# Patient Record
Sex: Male | Born: 1960 | Race: White | Hispanic: No | Smoking: Never smoker
Health system: Southern US, Community
[De-identification: ages and names within clinical notes are randomized; demographics above are authoritative.]

## PROBLEM LIST (undated history)

## (undated) DIAGNOSIS — K219 Gastro-esophageal reflux disease without esophagitis: Secondary | ICD-10-CM

## (undated) DIAGNOSIS — N200 Calculus of kidney: Secondary | ICD-10-CM

## (undated) HISTORY — DX: Calculus of kidney: N20.0

---

## 2021-07-03 ENCOUNTER — Ambulatory Visit: Payer: BC Managed Care – PPO

## 2021-07-03 ENCOUNTER — Ambulatory Visit (INDEPENDENT_AMBULATORY_CARE_PROVIDER_SITE_OTHER): Payer: BC Managed Care – PPO

## 2021-07-03 ENCOUNTER — Ambulatory Visit
Admission: EM | Admit: 2021-07-03 | Discharge: 2021-07-03 | Disposition: A | Discharge status: Home / Self Care | Payer: BC Managed Care – PPO

## 2021-07-03 DIAGNOSIS — M79672 Pain in left foot: Secondary | ICD-10-CM

## 2021-07-03 DIAGNOSIS — W133XXA Fall through floor, initial encounter: Secondary | ICD-10-CM

## 2021-07-03 DIAGNOSIS — S93402A Sprain of unspecified ligament of left ankle, initial encounter: Secondary | ICD-10-CM | POA: Diagnosis not present

## 2021-07-03 DIAGNOSIS — M25572 Pain in left ankle and joints of left foot: Secondary | ICD-10-CM | POA: Diagnosis not present

## 2021-07-03 HISTORY — DX: Gastro-esophageal reflux disease without esophagitis: K21.9

## 2021-07-03 MED ORDER — IBUPROFEN 600 MG PO TABS: 600.0000 mg | ORAL_TABLET | Freq: Four times a day (QID) | ORAL | 0 refills | Status: DC | PRN

## 2021-07-03 NOTE — ED Provider Notes (Signed)
[facility]    CSN: [id number] Arrival date & time: 07/03/21  1012      History   Chief Complaint Chief Complaint  Patient presents with   Foot Pain    Left    HPI Darren Choi is a 61 y.o. male.   HPI  61 year old male here for evaluation of orthopedic complaints.  Patient ports that he fell through the floor of the deck that he had built yesterday when the floor just gave out and states that most of his body weight was on his left leg, foot, and ankle.  He is complaining of pain in his heel mostly had not his foot.  He is also having pain in his ankle on the outside.  There is swelling and bruising to the left lower extremity.  He states it hurts to walk on but he is able to bear weight.  He denies any numbness or tingling in his toes.  Past Medical History:  Diagnosis Date   GERD (gastroesophageal reflux disease)     There are no problems to display for this patient.   History reviewed. No pertinent surgical history.     Home Medications    Prior to Admission medications   Medication Sig Start Date End Date Taking? Authorizing Provider  ibuprofen (ADVIL) 600 MG tablet Take 1 tablet (600 mg total) by mouth every 6 (six) hours as needed. 07/03/21  Yes Becky Augusta, NP  omeprazole (PRILOSEC) 20 MG capsule TAKE ONE CAPSULE BY MOUTH EVERY MORNING AND AT BEDTIME FOR GASTROESOPHAEAL REFLUX DISEASE, HIATAL HERNIA 02/17/20  Yes [provider]    Family History No family history on file.  Social History Social History   Tobacco Use   Smoking status: Never   Smokeless tobacco: Never  Vaping Use   Vaping Use: Never used  Substance Use Topics   Alcohol use: Yes    Comment: Occ.   Drug use: Not Currently     Allergies   Amoxicillin   Review of Systems Review of Systems  Musculoskeletal:  Positive for arthralgias and joint swelling. Negative for gait problem.  Skin:  Positive for color change. Negative for wound.  Neurological:   Negative for weakness and numbness.  Hematological: Negative.   Psychiatric/Behavioral: Negative.      Physical Exam Triage Vital Signs ED Triage Vitals  Enc Vitals Group     BP 07/03/21 1034 114/82     Pulse Rate 07/03/21 1034 77     Resp 07/03/21 1034 18     Temp 07/03/21 1034 98.4 F (36.9 C)     Temp Source 07/03/21 1034 Oral     SpO2 07/03/21 1034 99 %     Weight 07/03/21 1031 170 lb (77.1 kg)     Height 07/03/21 1031 5\' 9"  (1.753 m)     Head Circumference --      Peak Flow --      Pain Score 07/03/21 1027 9     Pain Loc --      Pain Edu? --      Excl. in GC? --    No data found.  Updated Vital Signs BP 114/82 (BP Location: Left Arm)   Pulse 77   Temp 98.4 F (36.9 C) (Oral)   Resp 18   Ht 5\' 9"  (1.753 m)   Wt 170 lb (77.1 kg)   SpO2 99%   BMI 25.10 kg/m   Visual Acuity Right Eye Distance:   Left Eye Distance:   Bilateral  Distance:    Right Eye Near:   Left Eye Near:    Bilateral Near:     Physical Exam Vitals and nursing note reviewed.  Constitutional:      Appearance: Normal appearance.  Musculoskeletal:        General: Swelling, tenderness and signs of injury present. No deformity. Normal range of motion.  Skin:    General: Skin is warm and dry.     Capillary Refill: Capillary refill takes less than 2 seconds.     Findings: Bruising present.  Neurological:     General: No focal deficit present.     Mental Status: He is alert and oriented to person, place, and time.  Psychiatric:        Mood and Affect: Mood normal.        Behavior: Behavior normal.        Thought Content: Thought content normal.        Judgment: Judgment normal.     UC Treatments / Results  Labs (all labs ordered are listed, but only abnormal results are displayed) Labs Reviewed - No data to display  EKG   Radiology DG Ankle Complete Left  Result Date: 07/03/2021 CLINICAL DATA:  Larey Seat through floor.  Left ankle pain and swelling. EXAM: LEFT ANKLE COMPLETE - 3+  VIEW COMPARISON:  None Available. FINDINGS: There is no evidence of fracture, dislocation, or joint effusion. There is no evidence of arthropathy or other focal bone abnormality. Lateral soft tissue swelling noted, but no radiopaque foreign body identified. IMPRESSION: Lateral soft tissue swelling. No evidence of fracture or dislocation. Electronically Signed   By: Danae Orleans M.D.   On: 07/03/2021 11:04   DG Foot Complete Left  Result Date: 07/03/2021 CLINICAL DATA:  Trauma, fall, pain EXAM: LEFT FOOT - COMPLETE 3+ VIEW COMPARISON:  None Available. FINDINGS: No recent fracture or dislocation is seen. 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 IMPRESSION: No recent fracture or dislocation is seen in the left foot. Electronically Signed   By: Ernie Avena M.D.   On: 07/03/2021 11:05    Procedures Procedures (including critical care time)  Medications Ordered in UC Medications - No data to display  Initial Impression / Assessment and Plan / UC Course  I have reviewed the triage vital signs and the nursing notes.  Pertinent labs & imaging results that were available during my care of the patient were reviewed by me and considered in my medical decision making (see chart for details).  Patient is a pleasant, nontoxic-appearing 61 year old male here for evaluation of left foot and ankle pain after falling through a deck floor yesterday.  He states that the deck was approximately 3 feet off the ground and most of his weight was on his left leg.  He denies any numbness or tingling in his toes and is able to bear weight but he does so with pain.  On exam patient's left foot and ankle are normal anatomical alignment.  His DP and PT pulses are 2+.  He is missing the distal aspect of his great toe and his second toe which she attributes to a lawnmower accident.  He does have full range of motion of his foot and ankle and full sensation in his toes.  There is mild ecchymosis to  the medial aspect of the ankle but there is more significant ecchymosis and swelling to the lateral aspect of the ankle.  He does have tenderness with palpation of the distal  third of the lateral malleolus.  Medial malleolus is nontender.  There is no tenderness with palpation of the arch or midfoot.  No pain with palpation of the calcaneus or Achilles tendon.  We will obtain radiograph of left foot and ankle.  Left foot x-rays independently reviewed and evaluated by me.  Impression: No evidence of fracture or dislocation.  There is soft tissue swelling present.  Radiology overread is pending. Radiology impression states there is no fracture or dislocation that is seen in the left foot.  Left ankle x-ray independently reviewed and evaluated by me.  Impression: No evidence of fracture or dislocation.  There is lateral soft tissue swelling visible on the x-ray.  Radiology overread is pending. Radiology impression states that there is no evidence of fracture, dislocation, or joint effusion.  No evidence of arthropathy or focal bone normality.  Lateral soft tissue swelling is noted.  No radiopaque foreign bodies identified.  I will discharge patient home with a diagnosis of left ankle contusion/sprain.  I will treat him as if it is a sprain with an ASO brace, elevation, rest, ice, compression, and ibuprofen.  I have advised him that he should keep his ankle elevated is much as possible to decrease swelling and aid in healing.  He works for [employer] [place] and states that he has to walk a lot for his job.  I have given him a work note.   Final Clinical Impressions(s) / UC Diagnoses   Final diagnoses:  Sprain of left ankle, unspecified ligament, initial encounter  Foot pain, left     Discharge Instructions      Keep your ankle elevated is much as possible to help decrease swelling and aid in healing.  Apply moist heat to your ankle for 20 minutes at a time to help improve blood flow which will  bring fresh oxygen and nutrients to the ligaments and help facilitate the removal of metabolic byproducts from inflammation.  Take ibuprofen, 600 mg every 6 hours with food to help with inflammation and pain.  Wear the ASO ankle brace when up and moving.  You may take it off at nighttime, when bathing, and when not walking on her ankle.  Follow the rehabilitation exercises given your discharge instructions.  Wait to start the phase 1 exercises until 48 hours after your injury to give time for the inflammation to go down.  Progress to phase 2 after you can complete phase 1 with out any significant pain.      ED Prescriptions     Medication Sig Dispense Auth. Provider   ibuprofen (ADVIL) 600 MG tablet Take 1 tablet (600 mg total) by mouth every 6 (six) hours as needed. 30 tablet Becky Augustayan, Daisee Centner, NP      PDMP not reviewed this encounter.   Becky Augustayan, Verl Kitson, NP 07/03/21 1214

## 2021-07-03 NOTE — ED Triage Notes (Signed)
Patient is here for "Left foot pain". Fell through a floor that I built. "Larey Seat completely through floor with body weight on left foot, ankle, leg".  Seems to be centralized in heel, foot. No head injury. No lacerations. Abrasion on lower right leg. DOI: [id number]. Time: "around lunch time".

## 2021-07-03 NOTE — Discharge Instructions (Signed)
Keep your ankle elevated is much as possible to help decrease swelling and aid in healing.  Apply moist heat to your ankle for 20 minutes at a time to help improve blood flow which will bring fresh oxygen and nutrients to the ligaments and help facilitate the removal of metabolic byproducts from inflammation.  Take ibuprofen, 600 mg every 6 hours with food to help with inflammation and pain.  Wear the ASO ankle brace when up and moving.  You may take it off at nighttime, when bathing, and when not walking on her ankle.  Follow the rehabilitation exercises given your discharge instructions.  Wait to start the phase 1 exercises until 48 hours after your injury to give time for the inflammation to go down.  Progress to phase 2 after you can complete phase 1 with out any significant pain.

## 2022-02-08 ENCOUNTER — Emergency Department: Payer: BC Managed Care – PPO

## 2022-02-08 ENCOUNTER — Emergency Department
Admission: EM | Admit: 2022-02-08 | Discharge: 2022-02-09 | Discharge status: Against Medical Advice | Payer: BC Managed Care – PPO | Attending: Emergency Medicine | Admitting: Emergency Medicine

## 2022-02-08 ENCOUNTER — Encounter: Payer: Self-pay | Admitting: Emergency Medicine

## 2022-02-08 DIAGNOSIS — R519 Headache, unspecified: Secondary | ICD-10-CM | POA: Insufficient documentation

## 2022-02-08 DIAGNOSIS — R42 Dizziness and giddiness: Secondary | ICD-10-CM | POA: Diagnosis not present

## 2022-02-08 DIAGNOSIS — Z5321 Procedure and treatment not carried out due to patient leaving prior to being seen by health care provider: Secondary | ICD-10-CM | POA: Insufficient documentation

## 2022-02-08 LAB — BASIC METABOLIC PANEL
Anion gap: 8 (ref 5–15)
BUN: 21 mg/dL — ABNORMAL HIGH (ref 6–20)
CO2: 22 mmol/L (ref 22–32)
Calcium: 9 mg/dL (ref 8.9–10.3)
Chloride: 110 mmol/L (ref 98–111)
Creatinine, Ser: 1.09 mg/dL (ref 0.61–1.24)
GFR, Estimated: >60 mL/min (ref >60)
Glucose, Bld: 99 mg/dL (ref 70–99)
Potassium: 4.2 mmol/L (ref 3.5–5.1)
Sodium: 140 mmol/L (ref 135–145)

## 2022-02-08 LAB — CBC
HCT: 40.6 % (ref 39.0–52.0)
Hemoglobin: 13.5 g/dL (ref 13.0–17.0)
MCH: 29.9 pg (ref 26.0–34.0)
MCHC: 33.3 g/dL (ref 30.0–36.0)
MCV: 89.8 fL (ref 80.0–100.0)
Platelets: 240 10*3/uL (ref 150–400)
RBC: 4.52 MIL/uL (ref 4.22–5.81)
RDW: 12.2 % (ref 11.5–15.5)
WBC: 6.4 10*3/uL (ref 4.0–10.5)
nRBC: 0 % (ref 0.0–0.2)

## 2022-02-08 LAB — TROPONIN I (HIGH SENSITIVITY): Troponin I (High Sensitivity): 2 ng/L (ref <18)

## 2022-02-08 NOTE — ED Triage Notes (Signed)
Pt presents via POV with complaints of "sharp pain" in the back of his head with associated dizziness that started tonight. He notes the pain started while he was at work- caused him to feel light headed and dizzy but no LOC or falls. Didn't hit his head, not hx of same. Denies CP or SOB.

## 2022-02-09 LAB — TROPONIN I (HIGH SENSITIVITY): Troponin I (High Sensitivity): 2 ng/L (ref <18)

## 2022-06-21 ENCOUNTER — Other Ambulatory Visit: Payer: Self-pay

## 2022-06-21 ENCOUNTER — Ambulatory Visit (INDEPENDENT_AMBULATORY_CARE_PROVIDER_SITE_OTHER): Payer: BC Managed Care – PPO

## 2022-06-21 ENCOUNTER — Ambulatory Visit
Admission: EM | Admit: 2022-06-21 | Discharge: 2022-06-21 | Disposition: A | Discharge status: Home / Self Care | Payer: BC Managed Care – PPO | Attending: Family Medicine | Admitting: Family Medicine

## 2022-06-21 DIAGNOSIS — J209 Acute bronchitis, unspecified: Secondary | ICD-10-CM | POA: Diagnosis not present

## 2022-06-21 DIAGNOSIS — Z720 Tobacco use: Secondary | ICD-10-CM

## 2022-06-21 MED ORDER — ALBUTEROL SULFATE HFA 108 (90 BASE) MCG/ACT IN AERS: 2.0000 | INHALATION_SPRAY | RESPIRATORY_TRACT | 0 refills | Status: DC | PRN

## 2022-06-21 MED ORDER — AZITHROMYCIN 250 MG PO TABS: ORAL_TABLET | ORAL | 0 refills | Status: DC

## 2022-06-21 MED ORDER — PREDNISONE 20 MG PO TABS: 40.0000 mg | ORAL_TABLET | Freq: Every day | ORAL | 0 refills | Status: AC

## 2022-06-21 NOTE — Discharge Instructions (Addendum)
Your chest x-ray showed evidence of bronchitis.  Stop by the pharmacy to pick up your steroids, antibiotics and albuterol inhaler and use as prescribed.  Go to the emergency department if your shortness of breath worsens or is accompanied by chest pain, back pain, sweating or vomiting.

## 2022-06-21 NOTE — ED Provider Notes (Signed)
[facility]    CSN: [id number] Arrival date & time: 06/21/22  7829      History   Chief Complaint Chief Complaint  Patient presents with   Cough   Headache   Fatigue    HPI Darren Choi is a 62 y.o. male.   HPI   Darren Choi presents for cough, headache and fatigue for the past 2 days. Denies fever, nasal congestion, rhinorrhea, vomiting, diarrhea, chest pain and rash.  He has been cutting dust and grass which he his allergic too. He is a former smoker who quit about 10 years ago. She smoked about a pack a day.      Past Medical History:  Diagnosis Date   GERD (gastroesophageal reflux disease)     There are no problems to display for this patient.   History reviewed. No pertinent surgical history.     Home Medications    Prior to Admission medications   Medication Sig Start Date End Date Taking? Authorizing Provider  albuterol (VENTOLIN HFA) 108 (90 Base) MCG/ACT inhaler Inhale 2 puffs into the lungs every 4 (four) hours as needed. 06/21/22  Yes Tyla Burgner, DO  azithromycin (ZITHROMAX Z-PAK) 250 MG tablet Take 2 tablets on day 1 then 1 tablet daily 06/21/22  Yes Jadan Rouillard, DO  omeprazole (PRILOSEC) 20 MG capsule TAKE ONE CAPSULE BY MOUTH EVERY MORNING AND AT BEDTIME FOR GASTROESOPHAEAL REFLUX DISEASE, HIATAL HERNIA 02/17/20  Yes [provider]  predniSONE (DELTASONE) 20 MG tablet Take 2 tablets (40 mg total) by mouth daily for 5 days. 06/21/22 06/26/22 Yes Demorris Choyce, DO  ibuprofen (ADVIL) 600 MG tablet Take 1 tablet (600 mg total) by mouth every 6 (six) hours as needed. 07/03/21   Becky Augusta, NP    Family History History reviewed. No pertinent family history.  Social History Social History   Tobacco Use   Smoking status: Never   Smokeless tobacco: Never  Vaping Use   Vaping Use: Never used  Substance Use Topics   Alcohol use: Yes    Comment: Occ.   Drug use: Not Currently     Allergies   Amoxicillin   Review of  Systems Review of Systems: negative unless otherwise stated in HPI.      Physical Exam Triage Vital Signs ED Triage Vitals [06/21/22 0932]  Enc Vitals Group     BP      Pulse      Resp 16     Temp      Temp Source Oral     SpO2      Weight      Height      Head Circumference      Peak Flow      Pain Score      Pain Loc      Pain Edu?      Excl. in GC?    No data found.  Updated Vital Signs BP 123/86 (BP Location: Left Arm)   Pulse (!) 110   Temp 98.8 F (37.1 C) (Oral)   Resp 16   Ht 5\' 9"  (1.753 m)   Wt 80.7 kg   SpO2 94%   BMI 26.29 kg/m   Visual Acuity Right Eye Distance:   Left Eye Distance:   Bilateral Distance:    Right Eye Near:   Left Eye Near:    Bilateral Near:     Physical Exam GEN:     alert, non-toxic appearing male in no distress    HENT:  mucus membranes moist, no nasal discharge EYES:   pupils equal and reactive, no scleral injection or discharge NECK:  normal ROM, no meningismus   RESP:  no increased work of breathing, coarse breathe sounds in bilateral lung bases  CVS:   regular  rhythm, tachycardic Skin:   warm and dry, no rash on visible skin    UC Treatments / Results  Labs (all labs ordered are listed, but only abnormal results are displayed) Labs Reviewed - No data to display  EKG   Radiology DG Chest 2 View  Result Date: 06/21/2022 CLINICAL DATA:  Shortness of breath, cough, headache, and fatigue for 2 days EXAM: CHEST - 2 VIEW COMPARISON:  02/08/2022 FINDINGS: Normal heart size, mediastinal contours, and pulmonary vascularity. Small hiatal hernia incidentally noted. Minimal chronic bronchitic changes. No acute infiltrate, pleural effusion, or pneumothorax. Osseous structures unremarkable. IMPRESSION: Small hiatal hernia. Bronchitic changes without infiltrate. Electronically Signed   By: Ulyses Southward M.D.   On: 06/21/2022 10:24    Procedures Procedures (including critical care time)  Medications Ordered in UC Medications  - No data to display  Initial Impression / Assessment and Plan / UC Course  I have reviewed the triage vital signs and the nursing notes.  Pertinent labs & imaging results that were available during my care of the patient were reviewed by me and considered in my medical decision making (see chart for details).       Pt is a 62 y.o. male former smoker who presents for cough with shortness of breath. Declin is afebrile here without recent antipyretics. Satting 90-94% on room air. Overall pt is non-toxic appearing, well hydrated, without respiratory distress. Pulmonary exam is remarkable for bibasilar coarse breath sounds. Chest xray personally reviewed by me without focal pneumonia, pleural effusion, cardiomegaly or pneumothorax.  Radiologist notes chronic bronchial changes.  I suspect patient may have underlying COPD.  Treat with steroids and antibiotics as below.  Albuterol inhaler for shortness of breath.  Typical duration of symptoms discussed.  ED precautions given and patient voiced understanding.  Return and ED precautions given and voiced understanding. Discussed MDM, treatment plan and plan for follow-up with patient who agrees with plan.     Final Clinical Impressions(s) / UC Diagnoses   Final diagnoses:  Acute bronchitis, unspecified organism  Tobacco use     Discharge Instructions      Your chest x-ray showed evidence of bronchitis.  Stop by the pharmacy to pick up your steroids, antibiotics and albuterol inhaler and use as prescribed.  Go to the emergency department if your shortness of breath worsens or is accompanied by chest pain, back pain, sweating or vomiting.     ED Prescriptions     Medication Sig Dispense Auth. Provider   albuterol (VENTOLIN HFA) 108 (90 Base) MCG/ACT inhaler Inhale 2 puffs into the lungs every 4 (four) hours as needed. 6.7 g Marsena Taff, DO   azithromycin (ZITHROMAX Z-PAK) 250 MG tablet Take 2 tablets on day 1 then 1 tablet daily 6  tablet Nixon Kolton, DO   predniSONE (DELTASONE) 20 MG tablet Take 2 tablets (40 mg total) by mouth daily for 5 days. 10 tablet Katha Cabal, DO      PDMP not reviewed this encounter.   Katha Cabal, DO 06/21/22 1047

## 2022-06-21 NOTE — ED Triage Notes (Addendum)
Pt c/o cough,HA & fatigue x2 days. Denies any fevers, has tried Nyquil,benadryl & sudafed w/o relief. States he also had 1 episode of sob yesterday, denies any sob currently.

## 2024-01-15 ENCOUNTER — Ambulatory Visit
Admission: EM | Admit: 2024-01-15 | Discharge: 2024-01-15 | Disposition: A | Discharge status: Home / Self Care | Attending: Emergency Medicine | Admitting: Emergency Medicine

## 2024-01-15 ENCOUNTER — Encounter: Payer: Self-pay | Admitting: Emergency Medicine

## 2024-01-15 DIAGNOSIS — J101 Influenza due to other identified influenza virus with other respiratory manifestations: Secondary | ICD-10-CM | POA: Diagnosis not present

## 2024-01-15 LAB — POCT INFLUENZA A/B
Influenza A, POC: POSITIVE — AB
Influenza B, POC: NEGATIVE

## 2024-01-15 MED ORDER — OSELTAMIVIR PHOSPHATE 75 MG PO CAPS: 75.0000 mg | ORAL_CAPSULE | Freq: Two times a day (BID) | ORAL | 0 refills | Status: DC

## 2024-01-15 MED ORDER — IBUPROFEN 600 MG PO TABS: 600.0000 mg | ORAL_TABLET | Freq: Four times a day (QID) | ORAL | 0 refills | Status: DC | PRN

## 2024-01-15 MED ORDER — ALBUTEROL SULFATE HFA 108 (90 BASE) MCG/ACT IN AERS: 1.0000 | INHALATION_SPRAY | RESPIRATORY_TRACT | 0 refills | Status: DC | PRN

## 2024-01-15 MED ORDER — AEROCHAMBER MV MISC: 1 refills | Status: DC

## 2024-01-15 MED ORDER — HYDROCOD POLI-CHLORPHE POLI ER 10-8 MG/5ML PO SUER: 5.0000 mL | Freq: Two times a day (BID) | ORAL | 0 refills | Status: DC | PRN

## 2024-01-15 NOTE — Discharge Instructions (Signed)
 Finish the Tamiflu, even if you feel better.  Mucinex, saline nasal irrigation with a Aureliano Med rinse with distilled water as often as you want, 600 mg of ibuprofen , 1000 mg of Tylenol together 3-4 times a day as needed for body aches, headaches.  2 puffs from your albuterol  inhaler using your spacer every 4-6 hours for coughing, wheezing.  Tussionex for cough.

## 2024-01-15 NOTE — ED Provider Notes (Signed)
 HPI  SUBJECTIVE:  Darren Choi is a 63 y.o. male who presents with fevers Tmax 101, body aches, headaches, nasal congestion, rhinorrhea, wheezing starting yesterday.  He was exposed to the flu over Thanksgiving from his grandkids.  No postnasal drip, sore throat, sinus pain or pressure, chest pain, shortness of breath.  Reports a dry cough that has been present for 3 weeks after having a URI.  The other symptoms have resolved, but the cough persists.  It has become slightly productive, but of clear sputum.  States that he is unable to sleep at night because of the cough.  He did not get the flu vaccine.  No antibiotics in the past 3 months.  No antipyretic in the past 6 hours.  He tried NyQuil without improvement of symptoms.  No aggravating factors.  He has a past medical history of GERD.  No history of pulm disease, smoking, chronic disease, diabetes, hypertension.  PCP: Nichole Arlyss Thresa Bernardino    Past Medical History:  Diagnosis Date   GERD (gastroesophageal reflux disease)     History reviewed. No pertinent surgical history.  History reviewed. No pertinent family history.  Social History   Tobacco Use   Smoking status: Never   Smokeless tobacco: Never  Vaping Use   Vaping status: Never Used  Substance Use Topics   Alcohol use: Yes    Comment: Occ.   Drug use: Not Currently    No current facility-administered medications for this encounter.  Current Outpatient Medications:    albuterol  (VENTOLIN  HFA) 108 (90 Base) MCG/ACT inhaler, Inhale 1-2 puffs into the lungs every 4 (four) hours as needed for wheezing or shortness of breath., Disp: 1 each, Rfl: 0   chlorpheniramine-HYDROcodone (TUSSIONEX) 10-8 MG/5ML, Take 5 mLs by mouth every 12 (twelve) hours as needed for cough., Disp: 60 mL, Rfl: 0   ibuprofen  (ADVIL ) 600 MG tablet, Take 1 tablet (600 mg total) by mouth every 6 (six) hours as needed., Disp: 30 tablet, Rfl: 0   oseltamivir (TAMIFLU) 75 MG capsule, Take 1 capsule  (75 mg total) by mouth 2 (two) times daily. X 5 days, Disp: 10 capsule, Rfl: 0   Spacer/Aero-Holding Chambers (AEROCHAMBER MV) inhaler, Use as instructed, Disp: 1 each, Rfl: 1   omeprazole (PRILOSEC) 20 MG capsule, TAKE ONE CAPSULE BY MOUTH EVERY MORNING AND AT BEDTIME FOR GASTROESOPHAEAL REFLUX DISEASE, HIATAL HERNIA, Disp: , Rfl:   Allergies  Allergen Reactions   Amoxicillin Hives     ROS  As noted in HPI.   Physical Exam  BP 104/83 (BP Location: Left Arm)   Pulse (!) 106   Temp 99.7 F (37.6 C) (Oral)   Resp 18   Wt 88.5 kg   SpO2 98%   BMI 28.80 kg/m   Constitutional: Well developed, well nourished, no acute distress.  Coughing. Eyes: PERRL, EOMI, conjunctiva normal bilaterally HENT: Normocephalic, atraumatic,mucus membranes moist positive nasal congestion.  Erythematous, swollen nasal mucosa.  No maxillary, frontal sinus tenderness.  Normal oropharynx. Neck: No cervical lymphadenopathy Respiratory: Clear to auscultation bilaterally, no rales, no wheezing, no rhonchi.  Fair air movement.  No anterior, lateral chest wall tenderness Cardiovascular: Regular tachycardia, no murmurs, no gallops, no rubs GI: nondistended skin: No rash, skin intact Musculoskeletal: no deformities Neurologic: Alert & oriented x 3, CN III-XII grossly intact, no motor deficits, sensation grossly intact Psychiatric: Speech and behavior appropriate   ED Course   Medications - No data to display  Orders Placed This Encounter  Procedures   POC  Influenza A/B    Standing Status:   Standing    Number of Occurrences:   1   Results for orders placed or performed during the hospital encounter of 01/15/24 (from the past 24 hours)  POC Influenza A/B     Status: Abnormal   Collection Time: 01/15/24 12:39 PM  Result Value Ref Range   Influenza A, POC Positive (A) Negative   Influenza B, POC Negative Negative   No results found.  ED Clinical Impression  1. Influenza A      ED  Assessment/Plan     Patient presents with an acute illness with systemic symptoms of tachycardia.  Influenza A positive.  Discussed all results with patient and family member while in department.  [place]  narcotic database reviewed.  Feel it is appropriate to proceed with a prescription for controlled substance.  No opiate prescriptions in the past 2 years  Patient reports a cough for 3 weeks, but states that it has not significantly changed.  He does have a history of GERD.  I considered getting a chest x-ray today, but given that he has new symptoms and he is flu A positive, I suspect that if he has a pneumonia, it could be influenza.  Advised him to follow-up with his PCP or return here if the cough persist or get worse and we can do a chest x-ray at that time.  He and his family member are amenable to this plan.  Last metabolic panel in December 23.  Calculated creatinine clearance from that is 89 mL/min.  He denies history of chronic kidney disease.  Sending home with Tamiflu , Tussionex, Mucinex, Flonase, Tylenol combined with ibuprofen  3-4 times a day, and albuterol  inhaler with spacer 2 puffs every 4-6 hours.  Work note.  Discussed labs,  MDM, treatment plan, and plan for follow-up with patient and family member.  They agree  with plan.   Meds ordered this encounter  Medications   oseltamivir  (TAMIFLU ) 75 MG capsule    Sig: Take 1 capsule (75 mg total) by mouth 2 (two) times daily. X 5 days    Dispense:  10 capsule    Refill:  0   albuterol  (VENTOLIN  HFA) 108 (90 Base) MCG/ACT inhaler    Sig: Inhale 1-2 puffs into the lungs every 4 (four) hours as needed for wheezing or shortness of breath.    Dispense:  1 each    Refill:  0   chlorpheniramine-HYDROcodone (TUSSIONEX) 10-8 MG/5ML    Sig: Take 5 mLs by mouth every 12 (twelve) hours as needed for cough.    Dispense:  60 mL    Refill:  0   Spacer/Aero-Holding Chambers (AEROCHAMBER MV) inhaler    Sig: Use as instructed     Dispense:  1 each    Refill:  1   ibuprofen  (ADVIL ) 600 MG tablet    Sig: Take 1 tablet (600 mg total) by mouth every 6 (six) hours as needed.    Dispense:  30 tablet    Refill:  0      *This clinic note was created using Scientist, clinical (histocompatibility and immunogenetics). Therefore, there may be occasional mistakes despite careful proofreading. ?    Van Knee, MD 01/15/24 [id number]

## 2024-01-15 NOTE — ED Triage Notes (Signed)
 Pt presents with cough, fever and bodyaches since yesterday. Pt has taken NyQuil for his symptoms.

## 2024-02-20 ENCOUNTER — Other Ambulatory Visit: Payer: Self-pay | Admitting: Internal Medicine

## 2024-02-20 NOTE — Telephone Encounter (Signed)
 Copied from CRM #[id number]. Topic: Clinical - Medication Refill >> Feb 20, 2024 10:17 AM Hadassah PARAS wrote: Medication: omeprazole (PRILOSEC) 40 MG capsule   Has the patient contacted their pharmacy? No (Agent: If no, request that the patient contact the pharmacy for the refill. If patient does not wish to contact the pharmacy document the reason why and proceed with request.) (Agent: If yes, when and what did the pharmacy advise?)  This is the patient's preferred pharmacy:  [facility] Address: [address] [place], [place], [place] [address] Phone: [phone]   Is this the correct pharmacy for this prescription? Yes If no, delete pharmacy and type the correct one.   Has the prescription been filled recently? No  Is the patient out of the medication? Yes  Has the patient been seen for an appointment in the last year OR does the patient have an upcoming appointment? Yes  Can we respond through MyChart? Yes  Agent: Please be advised that Rx refills may take up to 3 business days. We ask that you follow-up with your pharmacy.

## 2024-02-21 NOTE — Telephone Encounter (Signed)
 Requested Prescriptions  Refused Prescriptions Disp Refills   omeprazole  (PRILOSEC) 20 MG capsule       Gastroenterology: Proton Pump Inhibitors Failed - 02/21/2024  1:01 PM      Failed - Valid encounter within last 12 months    Recent Outpatient Visits   None

## 2024-02-28 ENCOUNTER — Encounter: Payer: Self-pay | Admitting: Internal Medicine

## 2024-02-28 ENCOUNTER — Other Ambulatory Visit: Payer: Self-pay

## 2024-02-28 ENCOUNTER — Telehealth: Payer: Self-pay

## 2024-02-28 ENCOUNTER — Ambulatory Visit: Admitting: Internal Medicine

## 2024-02-28 VITALS — BP 112/82 | Ht 69.0 in | Wt 193.0 lb

## 2024-02-28 DIAGNOSIS — L308 Other specified dermatitis: Secondary | ICD-10-CM

## 2024-02-28 DIAGNOSIS — Z136 Encounter for screening for cardiovascular disorders: Secondary | ICD-10-CM

## 2024-02-28 DIAGNOSIS — Z87442 Personal history of urinary calculi: Secondary | ICD-10-CM | POA: Insufficient documentation

## 2024-02-28 DIAGNOSIS — L309 Dermatitis, unspecified: Secondary | ICD-10-CM | POA: Insufficient documentation

## 2024-02-28 DIAGNOSIS — K219 Gastro-esophageal reflux disease without esophagitis: Secondary | ICD-10-CM | POA: Diagnosis not present

## 2024-02-28 DIAGNOSIS — R739 Hyperglycemia, unspecified: Secondary | ICD-10-CM | POA: Diagnosis not present

## 2024-02-28 DIAGNOSIS — R252 Cramp and spasm: Secondary | ICD-10-CM

## 2024-02-28 DIAGNOSIS — Z114 Encounter for screening for human immunodeficiency virus [HIV]: Secondary | ICD-10-CM | POA: Diagnosis not present

## 2024-02-28 DIAGNOSIS — Z1159 Encounter for screening for other viral diseases: Secondary | ICD-10-CM | POA: Diagnosis not present

## 2024-02-28 MED ORDER — OMEPRAZOLE 40 MG PO CPDR: 40.0000 mg | DELAYED_RELEASE_CAPSULE | Freq: Two times a day (BID) | ORAL | 1 refills | Status: DC

## 2024-02-28 MED ORDER — OMEPRAZOLE 40 MG PO CPDR: 40.0000 mg | DELAYED_RELEASE_CAPSULE | Freq: Two times a day (BID) | ORAL | 1 refills | Status: AC

## 2024-02-28 NOTE — Assessment & Plan Note (Signed)
 Continue moisturizing lotion 2 times daily Avoid excessively hot water as this can dry the skin out further Okay to take hydrocortisone 1% cream twice daily as needed as needed for itching

## 2024-02-28 NOTE — Telephone Encounter (Signed)
 Prescription resent to [facility].

## 2024-02-28 NOTE — Assessment & Plan Note (Signed)
 Avoid foods that trigger reflux Encouraged weight loss as this can help reduce reflux symptoms Will increase omeprazole  to 40 mg twice daily Consider referral to GI for upper endoscopy if symptoms persist or worsen.

## 2024-02-28 NOTE — Patient Instructions (Signed)
 GERD in Adults: Diet Changes When you have gastroesophageal reflux disease (GERD), you may need to make changes to your diet. Choosing the right foods can help with your symptoms. Think about working with an expert in healthy eating called a dietitian. They can help you make healthy food choices. What are tips for following this plan? Reading food labels Look for foods that are low in saturated fat. Foods that may help with your symptoms include: Foods with less than 5% of daily value (DV) of fat. Foods with 0 grams of trans fat. Cooking [employer] in ways that don't use a lot of fat. These ways include: Baking. Steaming. Grilling. Broiling. To add flavor, try to use herbs that are low in spice and acidity. Avoid frying your food. Meal planning  Eat small meals often rather than eating 3 large meals each day. Eat your meals slowly in a place where you feel relaxed. If told by your health care provider, avoid: Foods that cause symptoms. Keep a food diary to keep track of foods that cause symptoms. Alcohol. Drinking a lot of liquid with meals. General instructions For 2-3 hours after you eat, avoid: Bending over. Exercise. Lying down. Chew sugar-free gum after meals. What foods should I eat? Eat a healthy diet. Try to include: Foods with high amounts of fiber. These include: Fruits and vegetables. Whole grains and beans. Low-fat dairy products. Lean meats, fish, and poultry. Egg whites. Foods that cause symptoms in someone else may not cause symptoms for you. Work with your provider to find foods that are safe for you. The items listed above may not be all the foods and drinks you can have. Talk with a dietitian to learn more. The items listed above may not be a complete list of foods and beverages you can eat and drink. Contact a dietitian for more information. What foods should I avoid? Limiting some of these foods may help with your symptoms. Each person is different.  Talk with a dietitian or your provider to help you find the exact foods to avoid. Some of the foods to avoid may include: Fruits Fruits with a lot of acid in them. These may include citrus fruits, such as oranges, grapefruit, pineapple, and lemons. Vegetables Deep-fried vegetables, such as [place] fries. Vegetables, sauces, or toppings made with added fat and vegetables with acid in them. These may include tomatoes and tomato products, chili peppers, onions, garlic, and horseradish. Grains Pastries or quick breads with added fat. Meats and other proteins High-fat meats, such as fatty beef or pork, hot dogs, ribs, ham, sausage, salami, and bacon. Fried meat or protein, such as fried fish and fried chicken. Egg yolks. Fats and oils Butter. Margarine. Shortening. Ghee. Drinks Coffee and other drinks with caffeine in them. Fizzy and sugary drinks, such as soda and energy drinks. Fruit juice made with acidic fruits, such as orange or grapefruit. Tomato juice. Sweets and desserts Chocolate and cocoa. Donuts. Seasonings and condiments Mint, such as peppermint and spearmint. Condiments, herbs, or seasonings that cause symptoms. These may include curry, hot sauce, or vinegar-based salad dressings. The items listed above may not be all the foods and drinks you should avoid. Talk with a dietitian to learn more. Questions to ask your health care provider Changes to your diet and everyday life are often the first steps taken to manage symptoms of GERD. If these changes don't help, talk with your provider about taking medicines. Where to find more information [employer] for Gastrointestinal [employer]:  aboutgerd.org This information is not intended to replace advice given to you by your health care provider. Make sure you discuss any questions you have with your health care provider. Document Revised: 12/12/2022 Document Reviewed: 06/28/2022 Elsevier Patient Education  2024 [employer].

## 2024-02-28 NOTE — Assessment & Plan Note (Signed)
 Has not been an issue lately Will monitor

## 2024-02-28 NOTE — Telephone Encounter (Signed)
 Copied from CRM #[id number]. Topic: Clinical - Prescription Issue >> Feb 28, 2024  9:55 AM Zebedee SAUNDERS wrote: Reason for CRM: Pt needs omeprazole  (PRILOSEC) 40 MG capsule sent to [id number]  [address] [place] [address] Phone: [phone] Fax: [phone], it was sent to [facility]. Please send medication to [id number].

## 2024-02-28 NOTE — Progress Notes (Signed)
 "  Subjective:    Patient ID: Darren Choi, male    DOB: Mar 07, 1960, 64 y.o.   MRN: [id number]  HPI  Patient presents the clinic today to establish care and for management of the conditions listed below.  GERD: Triggered by tomato based foods, eating too late. He does have breakthrough on omeprazole . He takes tums 2-3 times per day in addition to this. There is no upper GI on file.    History of kidney stones: Last occurrence 4-5 years ago. He follows with urology.  Eczema: He does feel like this is worse in his armpits secondary to the deodorant that he uses.  He uses lotion OTC with some relief of symptoms. He is not using any cortisone cream OTC.  He also reports muscle cramping.  This occurs mostly in his legs when he is working outside.  He does feel like he consumes adequate amounts of water as well as Gatorade.  He also takes potassium and magnesium supplements OTC.  Past Medical History:  Diagnosis Date   GERD (gastroesophageal reflux disease)     Current Outpatient Medications  Medication Sig Dispense Refill   albuterol  (VENTOLIN  HFA) 108 (90 Base) MCG/ACT inhaler Inhale 1-2 puffs into the lungs every 4 (four) hours as needed for wheezing or shortness of breath. 1 each 0   chlorpheniramine-HYDROcodone (TUSSIONEX) 10-8 MG/5ML Take 5 mLs by mouth every 12 (twelve) hours as needed for cough. 60 mL 0   ibuprofen  (ADVIL ) 600 MG tablet Take 1 tablet (600 mg total) by mouth every 6 (six) hours as needed. 30 tablet 0   omeprazole  (PRILOSEC) 20 MG capsule TAKE ONE CAPSULE BY MOUTH EVERY MORNING AND AT BEDTIME FOR GASTROESOPHAEAL REFLUX DISEASE, HIATAL HERNIA     oseltamivir  (TAMIFLU ) 75 MG capsule Take 1 capsule (75 mg total) by mouth 2 (two) times daily. X 5 days 10 capsule 0   Spacer/Aero-Holding Chambers (AEROCHAMBER MV) inhaler Use as instructed 1 each 1   No current facility-administered medications for this visit.    Allergies[1]  No family history on file.  Social History    Socioeconomic History   Marital status: Divorced    Spouse name: Not on file   Number of children: Not on file   Years of education: Not on file   Highest education level: Not on file  Occupational History   Not on file  Tobacco Use   Smoking status: Never   Smokeless tobacco: Never  Vaping Use   Vaping status: Never Used  Substance and Sexual Activity   Alcohol use: Yes    Comment: Occ.   Drug use: Not Currently   Sexual activity: Not Currently  Other Topics Concern   Not on file  Social History Narrative   Not on file   Social Drivers of Health   Tobacco Use: Low Risk (01/15/2024)   Patient History    Smoking Tobacco Use: Never    Smokeless Tobacco Use: Never    Passive Exposure: Not on file  Financial Resource Strain: Not on file  Food Insecurity: Not on file  Transportation Needs: Not on file  Physical Activity: Not on file  Stress: Not on file  Social Connections: Not on file  Intimate Partner Violence: Not on file  Depression (EYV7-0): Not on file  Alcohol Screen: Not on file  Housing: Not on file  Utilities: Not on file  Health Literacy: Not on file     Constitutional: Denies fever, malaise, fatigue, headache or abrupt weight changes.  HEENT:  Denies eye pain, eye redness, ear pain, ringing in the ears, wax buildup, runny nose, nasal congestion, bloody nose, or sore throat. Respiratory: Denies difficulty breathing, shortness of breath, cough or sputum production.   Cardiovascular: Denies chest pain, chest tightness, palpitations or swelling in the hands or feet.  Gastrointestinal: Pt reports reflux. Denies abdominal pain, bloating, constipation, diarrhea or blood in the stool.  GU: Denies urgency, frequency, pain with urination, burning sensation, blood in urine, odor or discharge. Musculoskeletal: Pt reports muscle cramps in legs .Denies decrease in range of motion, difficulty with gait, or joint pain and swelling.  Skin: Patient reports dry skin.  Denies  redness, rashes, lesions or ulcercations.  Neurological: Denies dizziness, difficulty with memory, difficulty with speech or problems with balance and coordination.  Psych: Denies anxiety, depression, SI/HI.  No other specific complaints in a complete review of systems (except as listed in HPI above).  Objective    BP 112/82 (BP Location: Left Arm, Patient Position: Sitting, Cuff Size: Normal)   Ht 5' 9 (1.753 m)   Wt 193 lb (87.5 kg)   BMI 28.50 kg/m   Wt Readings from Last 3 Encounters:  01/15/24 195 lb (88.5 kg)  06/21/22 178 lb (80.7 kg)  07/03/21 170 lb (77.1 kg)    General: Appears his stated age, overweight, in NAD. Skin: Warm, dry and intact.  HEENT: Head: normal shape and size; Eyes: sclera white, no icterus, conjunctiva pink, PERRLA and EOMs intact; Cardiovascular: Normal rate and rhythm. S1,S2 noted.  No murmur, rubs or gallops noted. No JVD or BLE edema.  Pulmonary/Chest: Normal effort and positive vesicular breath sounds. No respiratory distress. No wheezes, rales or ronchi noted.  Abdomen: Soft and nontender. Normal bowel sounds.  Musculoskeletal: No difficulty with gait.  Neurological: Alert and oriented.  Coordination normal.  Psychiatric: Mood and affect normal. Behavior is normal. Judgment and thought content normal.    BMET    Component Value Date/Time   NA 140 02/08/2022 2138   K 4.2 02/08/2022 2138   CL 110 02/08/2022 2138   CO2 22 02/08/2022 2138   GLUCOSE 99 02/08/2022 2138   BUN 21 (H) 02/08/2022 2138   CREATININE 1.09 02/08/2022 2138   CALCIUM 9.0 02/08/2022 2138   GFRNONAA >60 02/08/2022 2138    Lipid Panel  No results found for: CHOL, TRIG, HDL, CHOLHDL, VLDL, LDLCALC  CBC    Component Value Date/Time   WBC 6.4 02/08/2022 2138   RBC 4.52 02/08/2022 2138   HGB 13.5 02/08/2022 2138   HCT 40.6 02/08/2022 2138   PLT 240 02/08/2022 2138   MCV 89.8 02/08/2022 2138   MCH 29.9 02/08/2022 2138   MCHC 33.3 02/08/2022 2138   RDW  12.2 02/08/2022 2138    Hgb A1C No results found for: HGBA1C     Assessment and Plan   Muscle cramps:  Encourage adequate hydration especially when working outside We will check c-Met and magnesium levels today    RTC in 6 months for your annual exam Angeline Laura, NP     [1]  Allergies Allergen Reactions   Amoxicillin Hives   "

## 2024-02-29 ENCOUNTER — Ambulatory Visit: Payer: Self-pay | Admitting: Internal Medicine

## 2024-02-29 LAB — COMPREHENSIVE METABOLIC PANEL WITH GFR
AG Ratio: 1.3 (calc) (ref 1.0–2.5)
ALT: 26 U/L (ref 9–46)
AST: 32 U/L (ref 10–35)
Albumin: 4.5 g/dL (ref 3.6–5.1)
Alkaline phosphatase (APISO): 85 U/L (ref 35–144)
BUN: 13 mg/dL (ref 7–25)
CO2: 28 mmol/L (ref 20–32)
Calcium: 9.5 mg/dL (ref 8.6–10.3)
Chloride: 103 mmol/L (ref 98–110)
Creat: 1.16 mg/dL (ref 0.70–1.35)
Globulin: 3.4 g/dL (ref 1.9–3.7)
Glucose, Bld: 75 mg/dL (ref 65–139)
Potassium: 4.5 mmol/L (ref 3.5–5.3)
Sodium: 137 mmol/L (ref 135–146)
Total Bilirubin: 0.6 mg/dL (ref 0.2–1.2)
Total Protein: 7.9 g/dL (ref 6.1–8.1)
eGFR: 71 mL/min/1.73m2

## 2024-02-29 LAB — CBC
HCT: 43.1 % (ref 39.4–51.1)
Hemoglobin: 13.9 g/dL (ref 13.2–17.1)
MCH: 28.5 pg (ref 27.0–33.0)
MCHC: 32.3 g/dL (ref 31.6–35.4)
MCV: 88.5 fL (ref 81.4–101.7)
MPV: 11.3 fL (ref 7.5–12.5)
Platelets: 250 Thousand/uL (ref 140–400)
RBC: 4.87 Million/uL (ref 4.20–5.80)
RDW: 13.4 % (ref 11.0–15.0)
WBC: 5.4 Thousand/uL (ref 3.8–10.8)

## 2024-02-29 LAB — HIV ANTIBODY (ROUTINE TESTING W REFLEX)
HIV 1&2 Ab, 4th Generation: NONREACTIVE
HIV FINAL INTERPRETATION: NEGATIVE

## 2024-02-29 LAB — MAGNESIUM: Magnesium: 2.3 mg/dL (ref 1.5–2.5)

## 2024-02-29 LAB — HEMOGLOBIN A1C
Hgb A1c MFr Bld: 5.6 %
Mean Plasma Glucose: 114 mg/dL
eAG (mmol/L): 6.3 mmol/L

## 2024-02-29 LAB — HEPATITIS C ANTIBODY: Hepatitis C Ab: NONREACTIVE

## 2024-02-29 LAB — LIPID PANEL
Cholesterol: 242 mg/dL — ABNORMAL HIGH
HDL: 58 mg/dL
LDL Cholesterol (Calc): 157 mg/dL — ABNORMAL HIGH
Non-HDL Cholesterol (Calc): 184 mg/dL — ABNORMAL HIGH
Total CHOL/HDL Ratio: 4.2 (calc)
Triglycerides: 142 mg/dL

## 2024-05-17 IMAGING — CR DG FOOT COMPLETE 3+V*L*
3 series · 3 of 3 positions shown · non-contrast
Comparison: None Available.

CLINICAL DATA: Trauma, fall, pain

EXAM:
LEFT FOOT - COMPLETE 3+ VIEW

[foot ap]
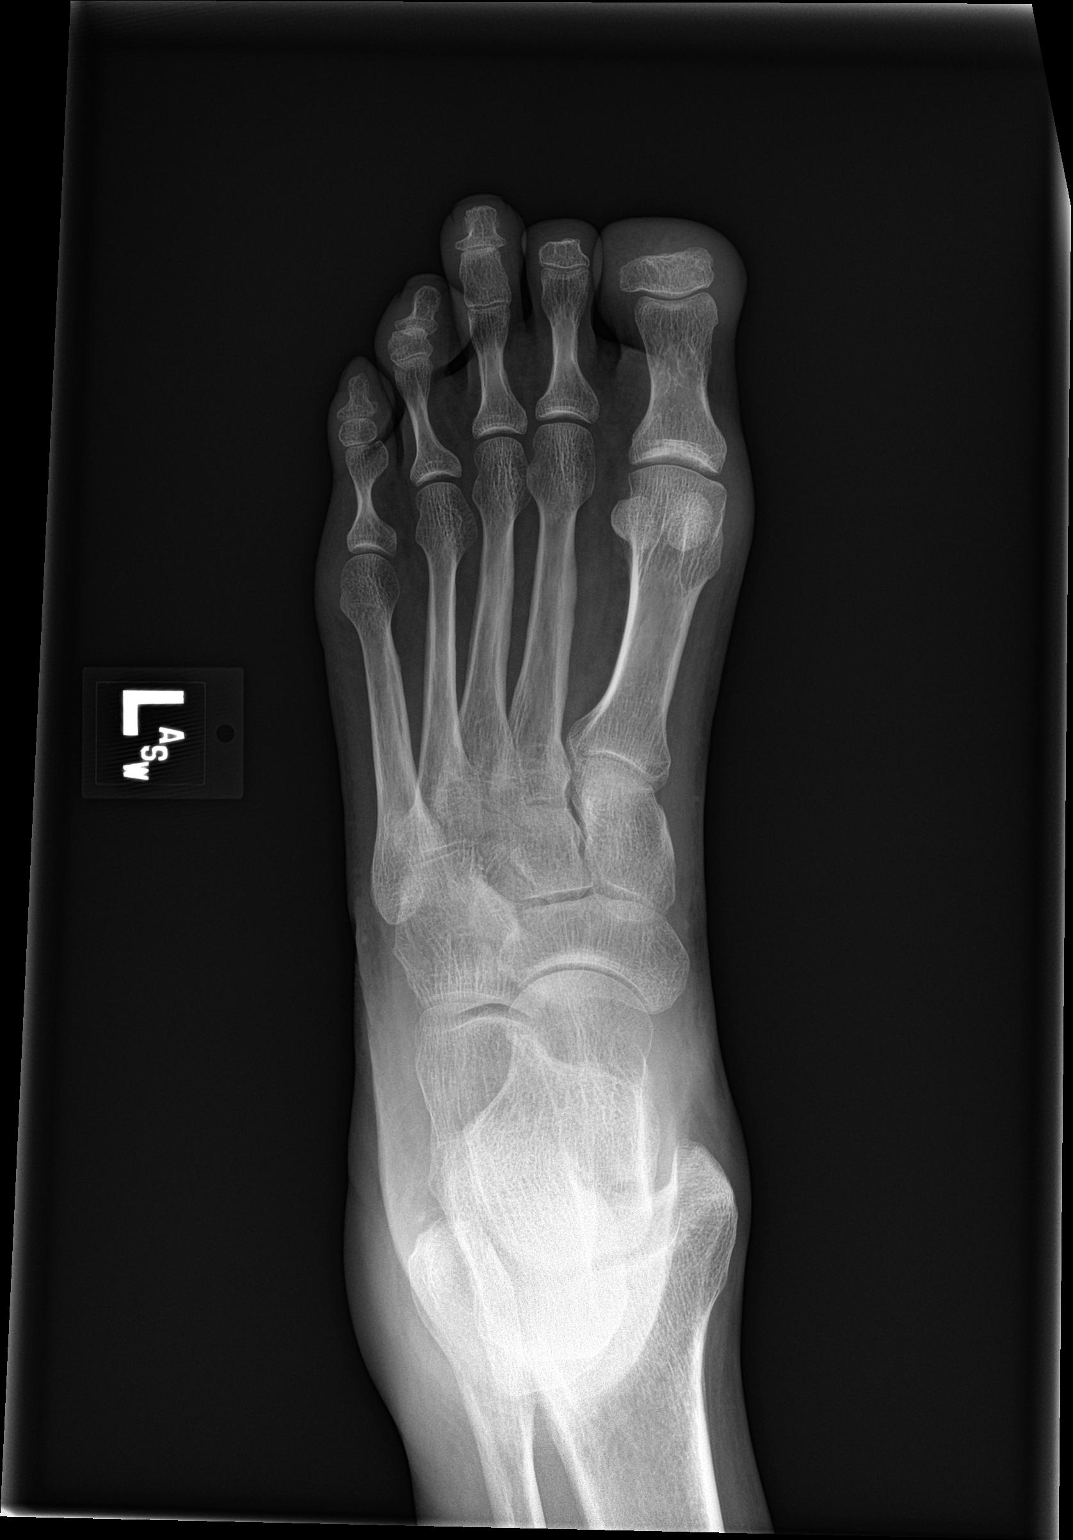

[foot obl]
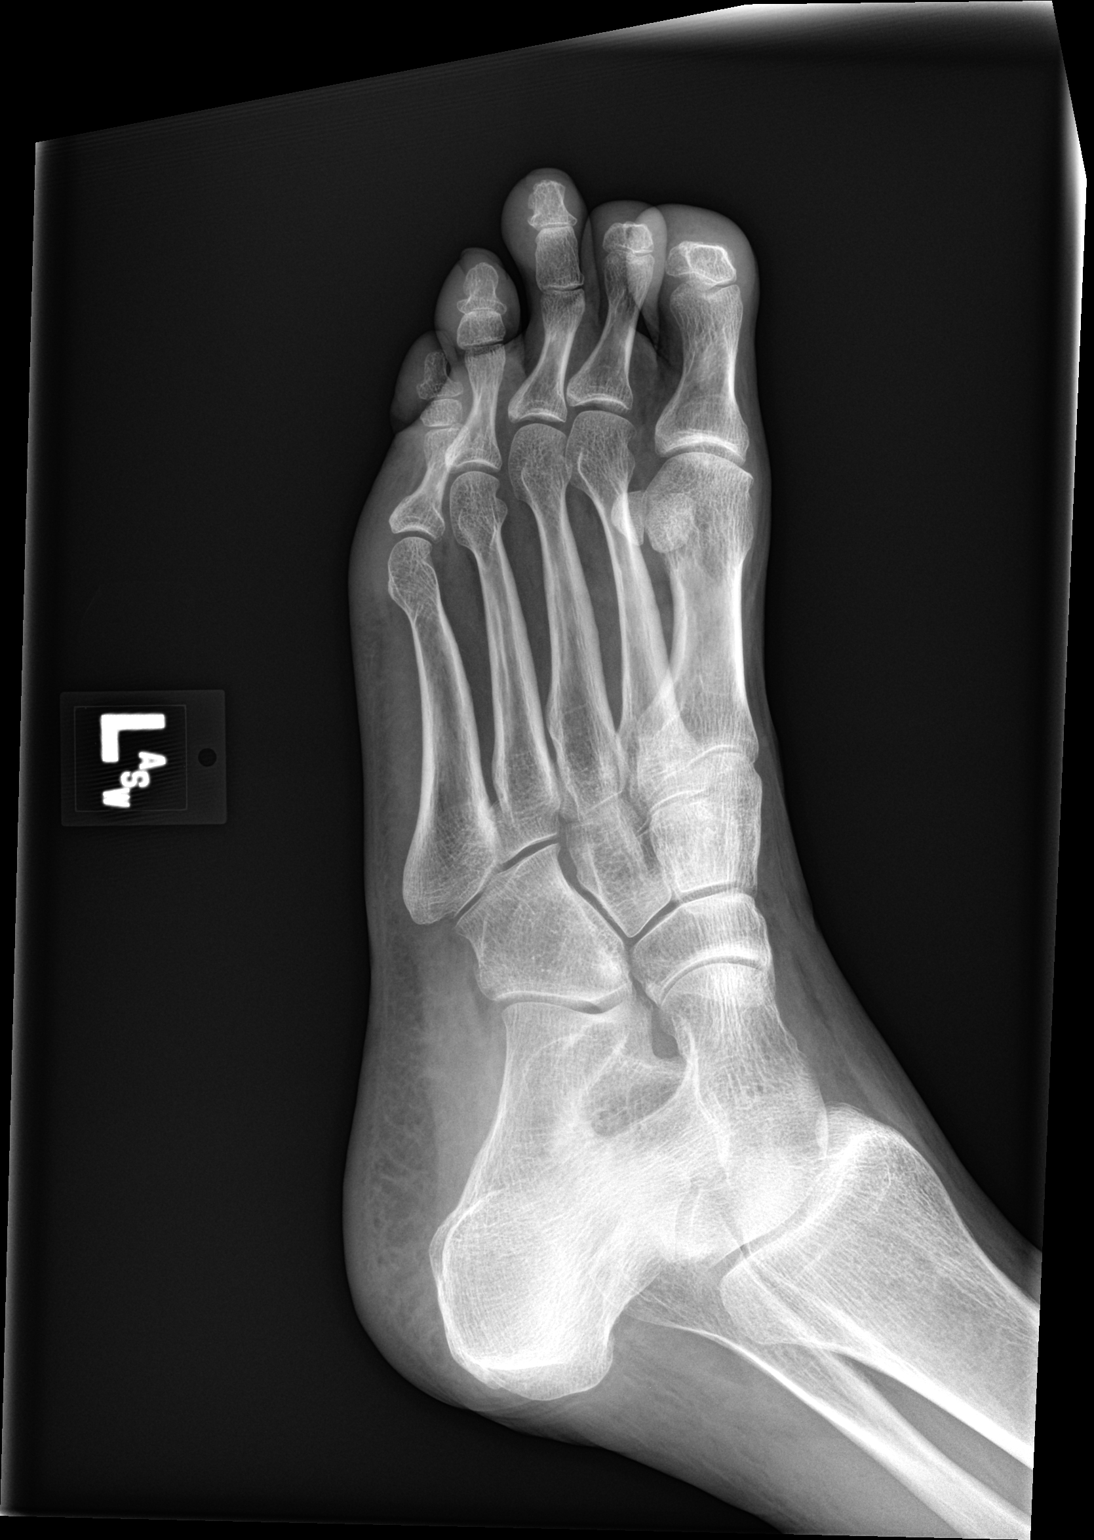

[foot lat]
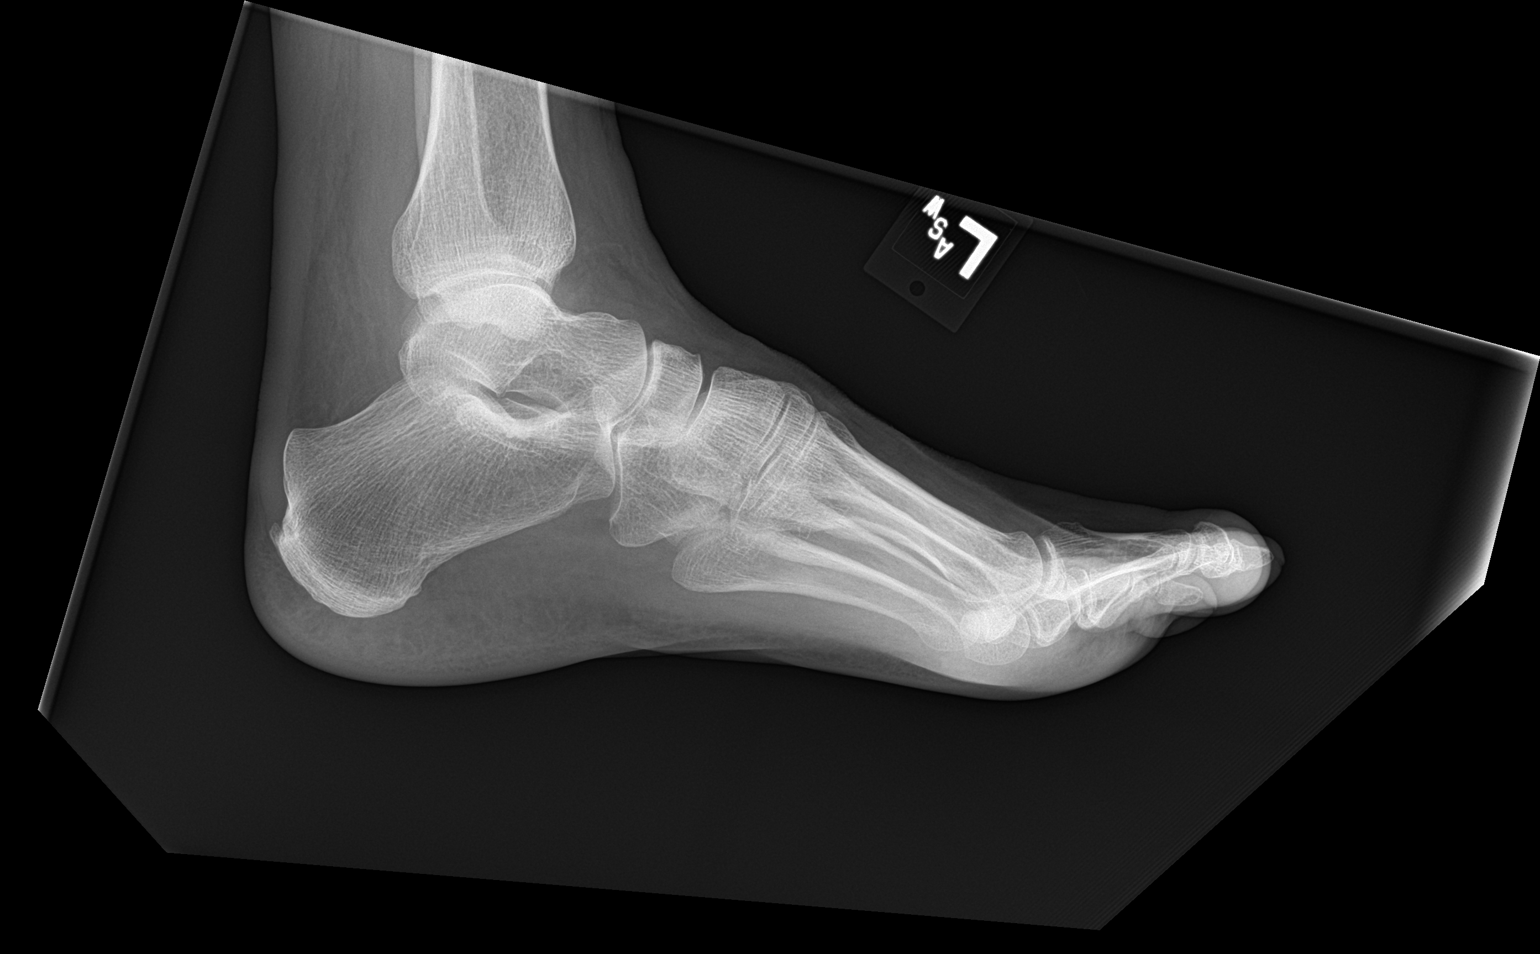

[3 of 3 positions shown; findings below may reference images not displayed]

FINDINGS: No recent fracture or dislocation is seen. There is previous partial
amputation of left first and second toes.
IMPRESSION: No recent fracture or dislocation is seen in the left foot.

## 2024-05-17 IMAGING — CR DG ANKLE COMPLETE 3+V*L*
4 series · 4 of 4 positions shown · non-contrast
Comparison: None Available.

CLINICAL DATA: Fell through floor.  Left ankle pain and swelling.

EXAM:
LEFT ANKLE COMPLETE - 3+ VIEW

[ankle ap]
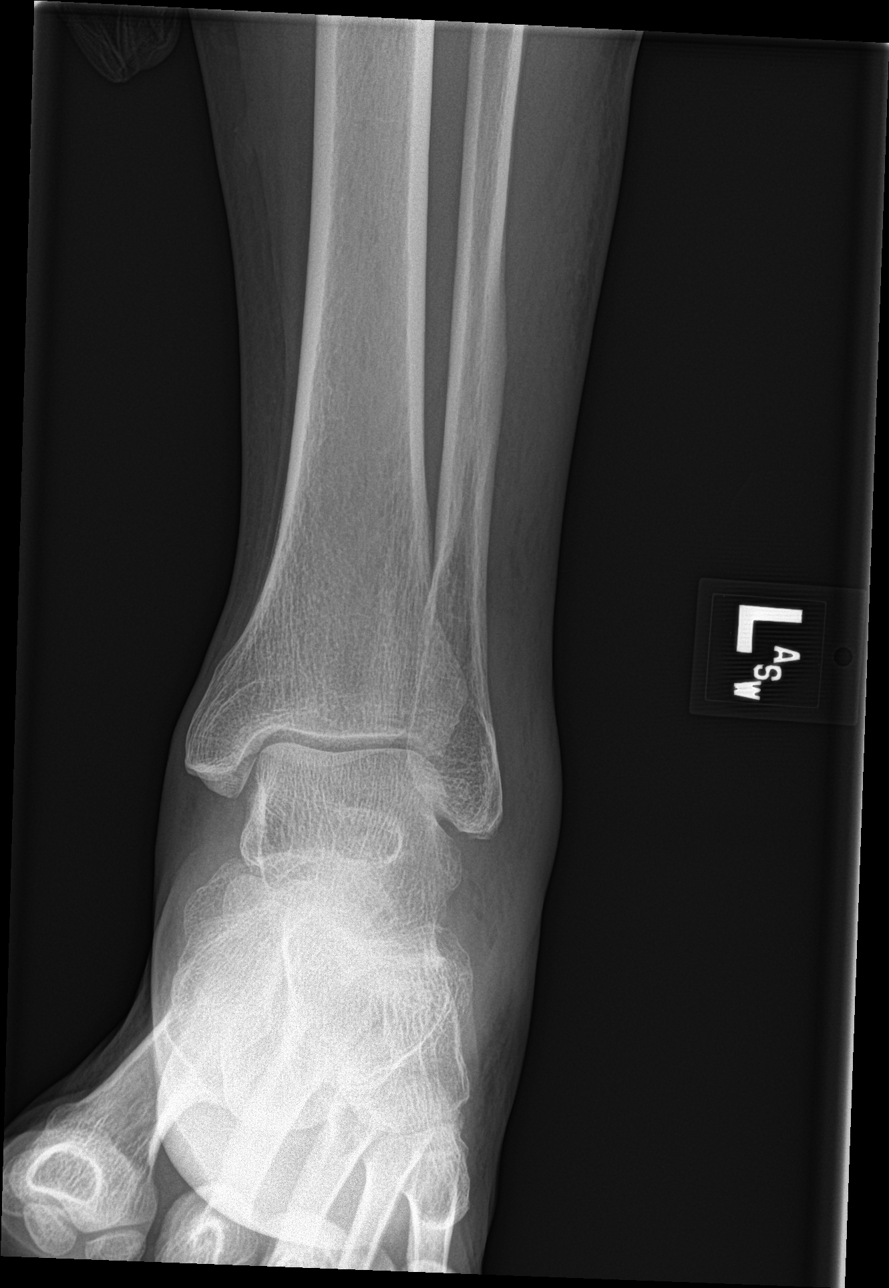

[ankle obl (1 of 2)]
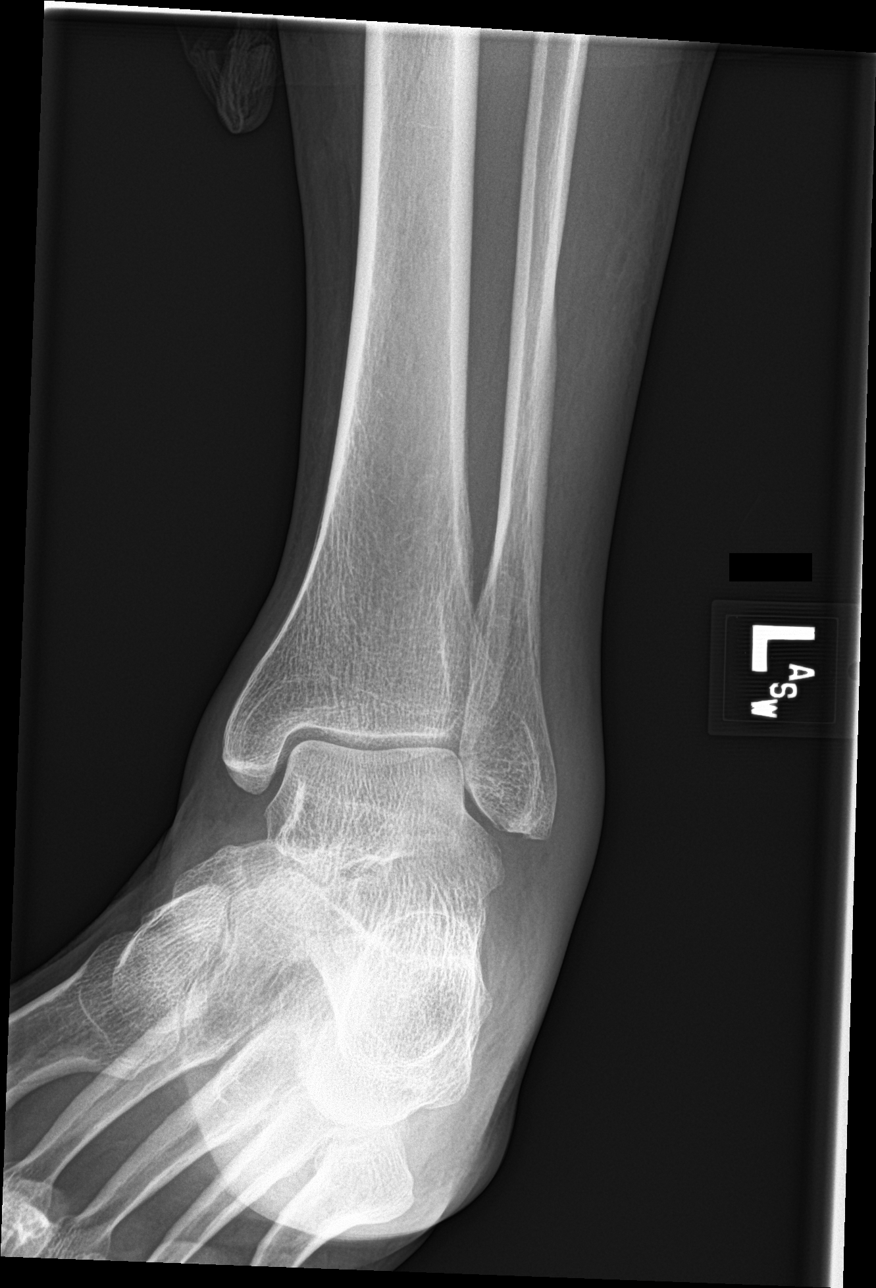

[ankle lat]
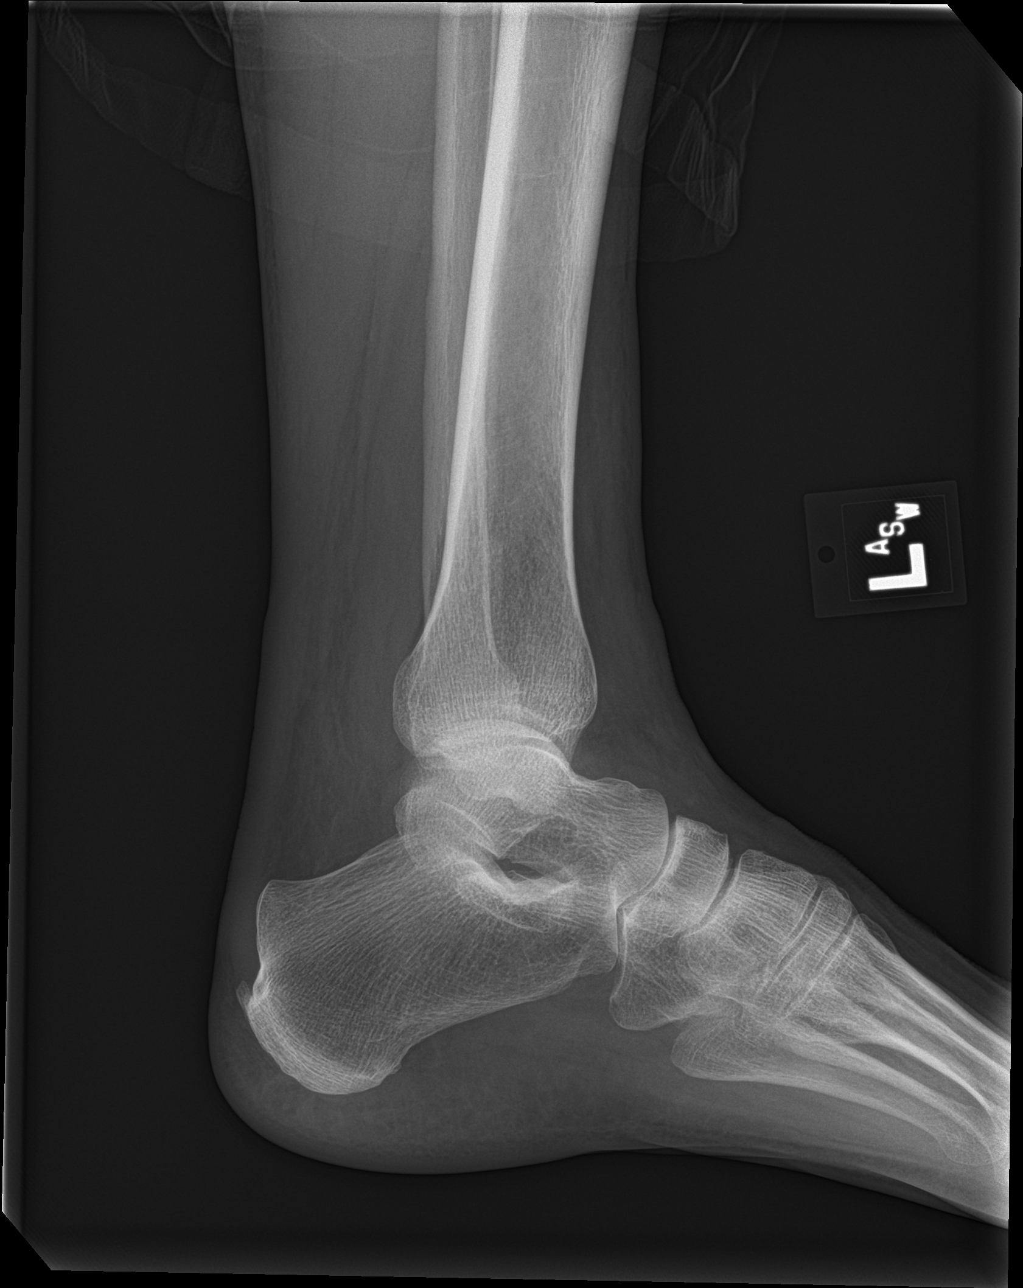

[ankle obl (2 of 2)]
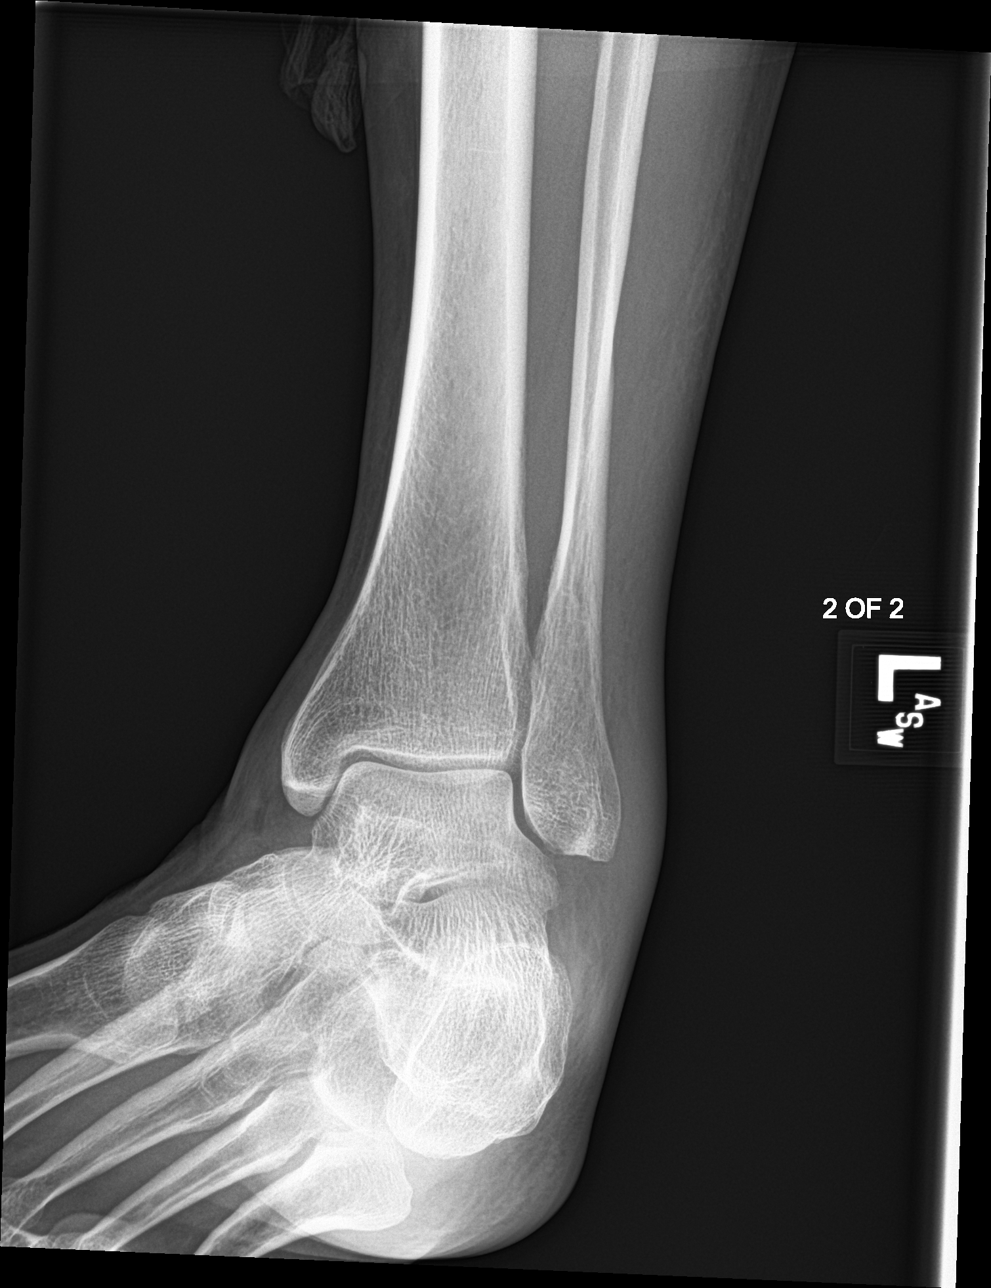

[4 of 4 positions shown; findings below may reference images not displayed]

FINDINGS: There is no evidence of fracture, dislocation, or joint effusion.
There is no evidence of arthropathy or other focal bone abnormality.
Lateral soft tissue swelling noted, but no radiopaque foreign body
identified.
IMPRESSION: Lateral soft tissue swelling. No evidence of fracture or
dislocation.

## 2024-08-25 ENCOUNTER — Encounter: Admitting: Internal Medicine
